# Patient Record
Sex: Male | Born: 1945 | Race: White | Marital: Married
Health system: Southern US, Community
[De-identification: ages and names within clinical notes are randomized; demographics above are authoritative.]

---

## 2015-05-19 ENCOUNTER — Other Ambulatory Visit: Payer: Self-pay | Admitting: Neurosurgery

## 2015-05-19 DIAGNOSIS — M4802 Spinal stenosis, cervical region: Secondary | ICD-10-CM

## 2015-06-02 ENCOUNTER — Ambulatory Visit
Admission: RE | Admit: 2015-06-02 | Discharge: 2015-06-02 | Disposition: A | Discharge status: Home / Self Care | Payer: Medicare HMO | Source: Ambulatory Visit | Attending: Neurosurgery | Admitting: Neurosurgery

## 2015-06-02 DIAGNOSIS — M4802 Spinal stenosis, cervical region: Secondary | ICD-10-CM

## 2015-06-02 MED ORDER — DIAZEPAM 5 MG PO TABS
5.0000 mg | ORAL_TABLET | Freq: Once | ORAL | Status: AC
  Administered 2015-06-02: 5 mg via ORAL

## 2015-06-02 MED ORDER — ONDANSETRON HCL 4 MG/2ML IJ SOLN
4.0000 mg | Freq: Once | INTRAMUSCULAR | Status: AC
  Administered 2015-06-02: 4 mg via INTRAMUSCULAR

## 2015-06-02 MED ORDER — IOHEXOL 300 MG/ML  SOLN
10.0000 mL | Freq: Once | INTRAMUSCULAR | Status: AC | PRN
  Administered 2015-06-02: 10 mL via INTRATHECAL

## 2015-06-02 MED ORDER — MEPERIDINE HCL 100 MG/ML IJ SOLN
75.0000 mg | Freq: Once | INTRAMUSCULAR | Status: AC
  Administered 2015-06-02: 75 mg via INTRAMUSCULAR

## 2015-06-02 NOTE — Discharge Instructions (Signed)

## 2015-06-02 NOTE — Progress Notes (Signed)
Patient states he has been off Plavix for at least the past five days. 

## 2015-07-06 ENCOUNTER — Other Ambulatory Visit: Payer: Self-pay | Admitting: *Deleted

## 2015-07-06 DIAGNOSIS — R55 Syncope and collapse: Secondary | ICD-10-CM

## 2015-07-06 DIAGNOSIS — I6522 Occlusion and stenosis of left carotid artery: Secondary | ICD-10-CM

## 2015-07-11 ENCOUNTER — Ambulatory Visit (HOSPITAL_COMMUNITY)
Admission: RE | Admit: 2015-07-11 | Discharge: 2015-07-11 | Disposition: A | Discharge status: Home / Self Care | Payer: Medicare HMO | Source: Ambulatory Visit | Attending: Vascular Surgery | Admitting: Vascular Surgery

## 2015-07-11 DIAGNOSIS — R55 Syncope and collapse: Secondary | ICD-10-CM | POA: Diagnosis not present

## 2015-07-11 DIAGNOSIS — I6522 Occlusion and stenosis of left carotid artery: Secondary | ICD-10-CM

## 2015-07-11 DIAGNOSIS — I6523 Occlusion and stenosis of bilateral carotid arteries: Secondary | ICD-10-CM | POA: Diagnosis not present

## 2017-07-28 IMAGING — CT CT CERVICAL SPINE W/ CM
2 series · 10 of 14 positions shown, 12 images · non-contrast
Comparison: none

CLINICAL DATA: Cervical disc disease. Cervicalgia. Right upper
extremity radiculitis with pain from the right shoulder to the
wrist. The patient denies any pain or numbness into the fingers.
TECHNIQUE: Contiguous axial images were obtained through the Cervical spine
after the intrathecal infusion of infusion. Coronal and sagittal
reconstructions were obtained of the axial image sets.

[Series 2: cspine soft · axial · 0.23mm/px · z∈[-208,-80]mm · 5 of 97 slices shown]
[im 17/97  soft-tissue]
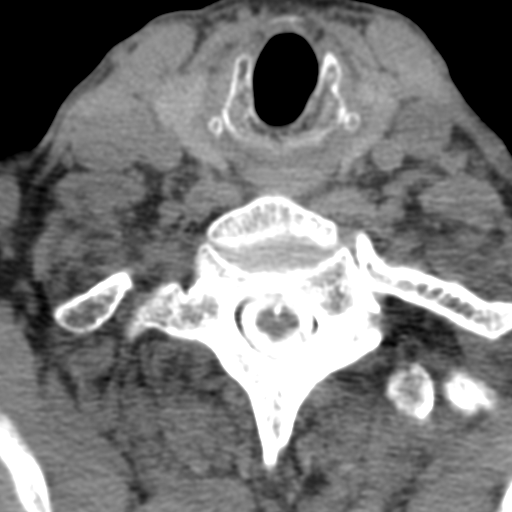
[im 33/97  soft-tissue]
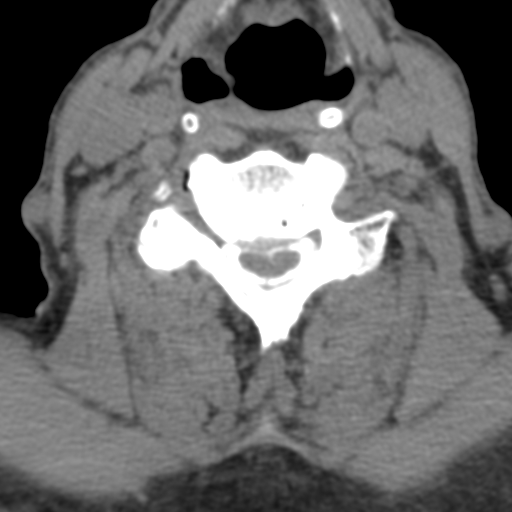
[im 49/97  soft-tissue]
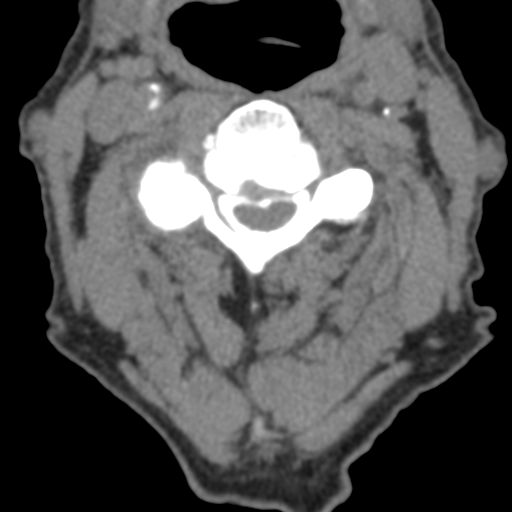
[im 65/97  soft-tissue]
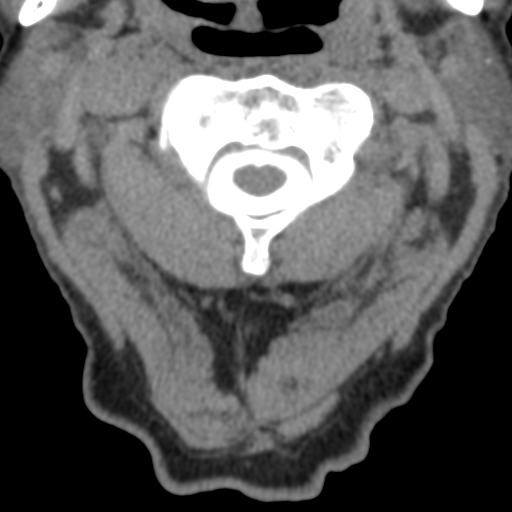
[im 81/97  soft-tissue]
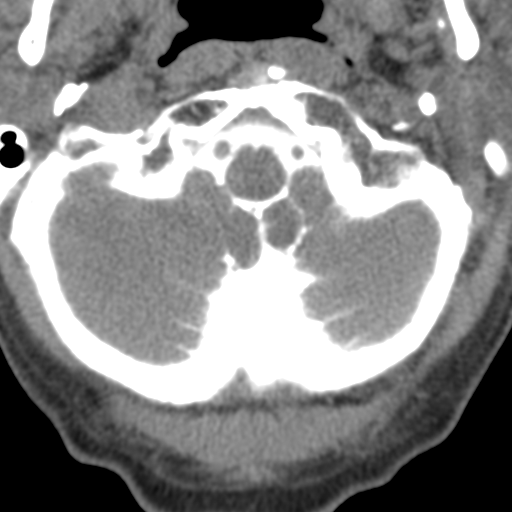

[Series 8: angled axial · axial · 0.23mm/px · z∈[-232,-109]mm · 5 of 97 slices shown, 7 images]
[im 17/97  soft-tissue]
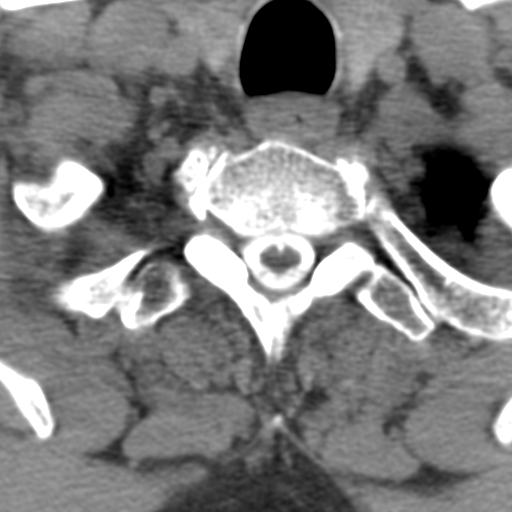
[im 17/97  bone]
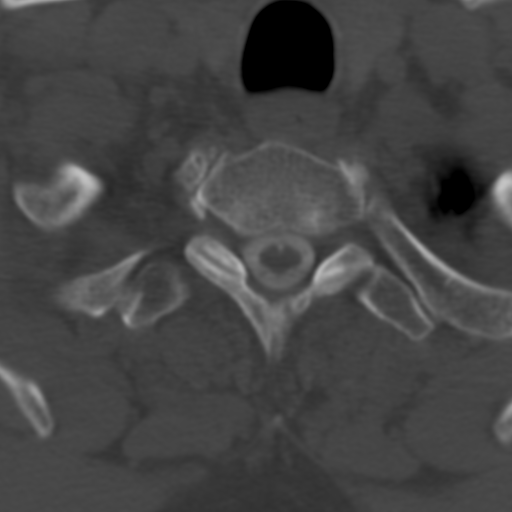
[im 33/97  bone]
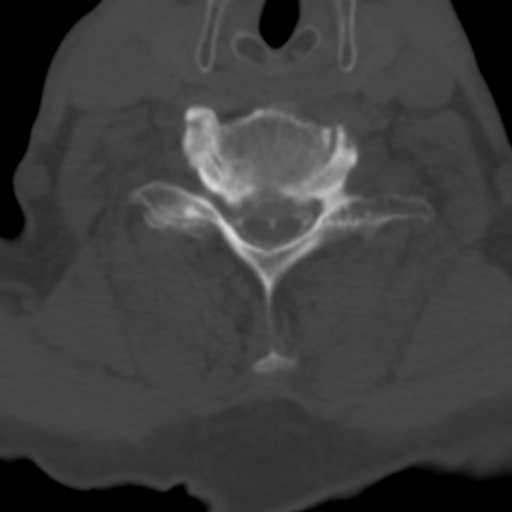
[im 49/97  bone]
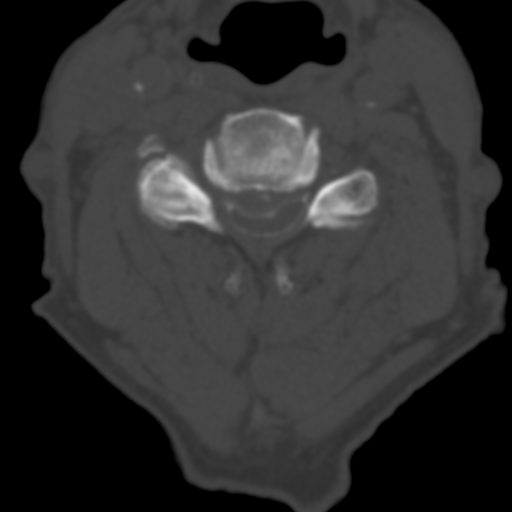
[im 65/97  bone]
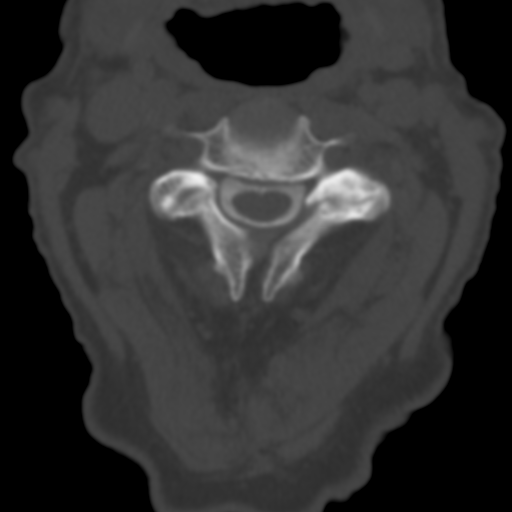
[im 81/97  soft-tissue]
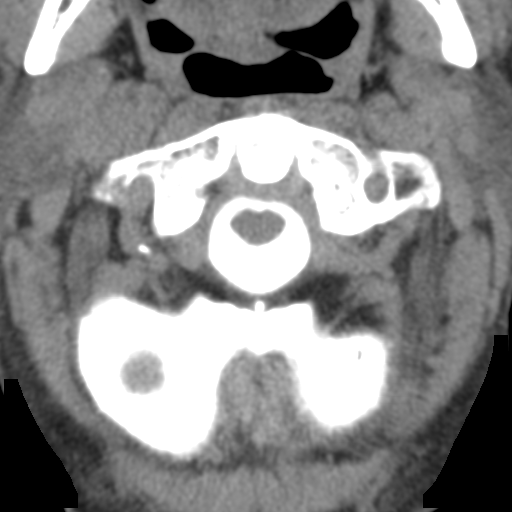
[im 81/97  bone]
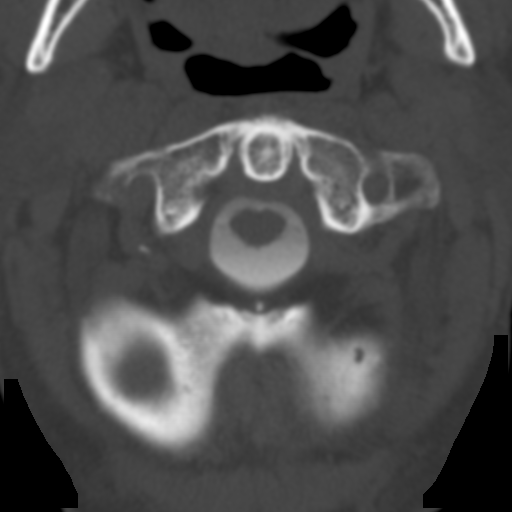

[10 of 14 positions shown; findings below may reference images not displayed]

FLUOROSCOPY TIME:  251.08 uGy*m2

PROCEDURE:
LUMBAR PUNCTURE FOR CERVICAL MYELOGRAM

After thorough discussion of risks and benefits of the procedure
including bleeding, infection, injury to nerves, blood vessels,
adjacent structures as well as headache and CSF leak, written and
oral informed consent was obtained. Consent was obtained by Dr.
Lkw Tiger. We discussed the high likelihood of obtaining a
diagnostic study.

Patient was positioned prone on the fluoroscopy table. Local
anesthesia was provided with 1% lidocaine without epinephrine after
prepped and draped in the usual sterile fashion. Puncture was
performed at L2-3 using a 3 1/2 inch 22-gauge spinal needle via
right paramedian approach. Using a single pass through the dura, the
needle was placed within the thecal sac, with return of clear CSF.
10 mL of Isovue-M 300 was injected into the thecal sac, with normal
opacification of the nerve roots and cauda equina consistent with
free flow within the subarachnoid space. The patient was then moved
to the trendelenburg position and contrast flowed into the Cervical
spine region.

I personally performed the lumbar puncture and administered the
intrathecal contrast. I also personally supervised acquisition of
the myelogram images.
FINDINGS: CERVICAL MYELOGRAM FINDINGS:

There significant nerve root filling defects bilaterally at C5-6 and
C6-7, right greater than left. Severe foraminal narrowing is present
at C4-5 bilaterally. A broad-based disc osteophyte complex and
foraminal narrowing is also present at C3-4.

Chronic loss of disc height at C5-6 and C6-7 is noted. There is no
significant flexion or extension at these 2 levels. Exaggerated
flexion and extension is evident at the adjacent level, C4-5.

CT CERVICAL MYELOGRAM FINDINGS:

The a cervical spine is imaged from the skullbase through T1-2.
Vertebral body heights are maintained. Chronic endplate sclerotic
changes are present posteriorly in the right C5-6 and C6-7. More
mild endplate changes are present posteriorly at C3-4 and C4-5.
Alignment is anatomic.

Craniocervical junction is within normal limits. Degenerative
changes are present at C1-2 without focal stenosis.

C2-3: Asymmetric left-sided facet hypertrophy and uncovertebral
spurring lead to moderate left foraminal stenosis. The central canal
and right foramen are patent.

C3-4: A broad-based disc osteophyte complex effaces the ventral CSF.
Uncovertebral and facet disease contribute to moderate foraminal
stenosis bilaterally.

C4-5: A large right paramedian soft disc protrusion is again seen.
This narrows the canal and cord to 5 mm. Asymmetric right-sided
facet hypertrophy is present. Uncovertebral spurring is noted
bilaterally. Moderate to severe right and moderate left foraminal
stenosis is again noted.

C5-6: A rightward disc osteophyte complex is present. Uncovertebral
spurring and facet disease lead to severe right and moderate left
foraminal stenosis. There is moderate central canal stenosis with
effacement of ventral CSF and slight flattening of the cord.

C6-7: A broad-based disc osteophyte complex effaces the ventral CSF.
The canal is narrowed to 7.5 mm. Severe foraminal stenosis is worse
left than right.

C7-T1: Minimal uncovertebral and facet disease is present
bilaterally without significant stenosis.
IMPRESSION: 1. Large right paramedian disc protrusion with distortion of the
cord and central canal. The canal is narrowed to 5 mm.
2. Severe right and moderate left foraminal stenosis at C4-5 is also
stable.
3. Moderate left foraminal narrowing at C2-3.
4. Moderate foraminal stenosis bilaterally at C3-4.
5. Severe right and moderate left foraminal stenosis at C5-6 due to
a rightward disc osteophyte complex and facet disease.
6. Moderate central canal stenosis at C5-6.
7. Moderate central canal stenosis at C6-7 with severe foraminal
narrowing bilaterally, left greater than right peer
8. Minimal uncovertebral and facet disease at C7-T1 without focal
stenosis.

## 2019-03-24 DIAGNOSIS — I361 Nonrheumatic tricuspid (valve) insufficiency: Secondary | ICD-10-CM
